# Patient Record
Sex: Male | Born: 1955 | Race: White | Hispanic: No | Marital: Married | Smoking: Never smoker
Health system: Southern US, Community
[De-identification: ages and names within clinical notes are randomized; demographics above are authoritative.]

## PROBLEM LIST (undated history)

## (undated) DIAGNOSIS — C4491 Basal cell carcinoma of skin, unspecified: Secondary | ICD-10-CM

## (undated) DIAGNOSIS — E785 Hyperlipidemia, unspecified: Secondary | ICD-10-CM

## (undated) HISTORY — DX: Hyperlipidemia, unspecified: E78.5

## (undated) HISTORY — PX: MENISCUS REPAIR: SHX5179

## (undated) HISTORY — PX: ROTATOR CUFF REPAIR: SHX139

## (undated) HISTORY — DX: Basal cell carcinoma of skin, unspecified: C44.91

---

## 2002-10-12 ENCOUNTER — Ambulatory Visit (HOSPITAL_COMMUNITY): Admission: RE | Admit: 2002-10-12 | Discharge: 2002-10-12 | Payer: Self-pay | Admitting: *Deleted

## 2016-08-15 ENCOUNTER — Ambulatory Visit: Payer: Self-pay | Admitting: Podiatry

## 2016-08-27 ENCOUNTER — Ambulatory Visit (INDEPENDENT_AMBULATORY_CARE_PROVIDER_SITE_OTHER): Payer: 59 | Admitting: Podiatry

## 2016-08-27 ENCOUNTER — Ambulatory Visit (INDEPENDENT_AMBULATORY_CARE_PROVIDER_SITE_OTHER): Payer: 59

## 2016-08-27 DIAGNOSIS — Q667 Congenital pes cavus, unspecified foot: Secondary | ICD-10-CM

## 2016-08-27 DIAGNOSIS — M779 Enthesopathy, unspecified: Secondary | ICD-10-CM | POA: Diagnosis not present

## 2016-08-27 DIAGNOSIS — M79672 Pain in left foot: Principal | ICD-10-CM

## 2016-08-27 DIAGNOSIS — M79671 Pain in right foot: Secondary | ICD-10-CM

## 2016-08-27 MED ORDER — TRIAMCINOLONE ACETONIDE 10 MG/ML IJ SUSP
10.0000 mg | Freq: Once | INTRAMUSCULAR | Status: AC
  Administered 2016-08-27: 10 mg

## 2016-08-27 NOTE — Progress Notes (Signed)
   Subjective:    Patient ID: Derek Neal, male    DOB: 1956-03-06, 61 y.o.   MRN: [id number]  HPI Pain in the ball of both feet between the 4th and 5th toes     Review of Systems  All other systems reviewed and are negative.      Objective:   Physical Exam        Assessment & Plan:

## 2016-08-29 NOTE — Progress Notes (Signed)
Subjective:     Patient ID: Derek Neal, male   DOB: 02-Dec-1955, 61 y.o.   MRN: [id number]  HPI patient presents with quite a bit of discomfort right over left foot with inflammation around the joint and possibility for some kind of shooting pain that's been present for several months   Review of Systems  All other systems reviewed and are negative.      Objective:   Physical Exam  Constitutional: He is oriented to person, place, and time.  Cardiovascular: Intact distal pulses.   Musculoskeletal: Normal range of motion.  Neurological: He is oriented to person, place, and time.  Skin: Skin is warm.  Nursing note and vitals reviewed.  neurovascular status intact muscle strength adequate range of motion within normal limits with patient found to have inflammatory changes of the third MPJ right over left with fluid buildup around the joint and inability to wear shoe gear comfortably. Patient states that she's been walking differently     Assessment:     Appears to be inflammatory capsulitis third MPJ right with fluid buildup around the joint surface    Plan:     H&P x-rays reviewed and today I did a proximal nerve block aspirated the third MPJ right to get a small amount of clear fluid and injected with quarter cc deck Smith some Kenalog and applied padding to reduce pressure on the joint. Advised on shoe gear modification and reappoint to recheck  X-ray report indicates there is no signs of fracture or other bone pathology or arthritis

## 2016-09-12 ENCOUNTER — Ambulatory Visit (INDEPENDENT_AMBULATORY_CARE_PROVIDER_SITE_OTHER): Payer: 59 | Admitting: Podiatry

## 2016-09-12 DIAGNOSIS — M779 Enthesopathy, unspecified: Secondary | ICD-10-CM | POA: Diagnosis not present

## 2016-09-14 NOTE — Progress Notes (Signed)
Subjective:    Patient ID: Derek Neal, male   DOB: 61 y.o.   MRN: [id number]   HPI patient states she's feeling much better with his right foot with only mild discomfort at this time    ROS      Objective:  Physical Exam Neurovascular status intact with significant reduction of inflammation third MPJ right with fluid buildup reduced    Assessment:    Capsulitis third MPJ right that's improving quite a bit     Plan:    H&P x-rays were reviewed with patient and today advised on continued padding rigid bottom shoes and not going barefoot and will be seen back if symptomatic and will require orthotics

## 2017-03-28 ENCOUNTER — Other Ambulatory Visit: Payer: Self-pay | Admitting: Orthopaedic Surgery

## 2017-03-28 DIAGNOSIS — M25512 Pain in left shoulder: Secondary | ICD-10-CM

## 2017-04-21 ENCOUNTER — Other Ambulatory Visit: Payer: No Typology Code available for payment source

## 2017-04-23 ENCOUNTER — Ambulatory Visit
Admission: RE | Admit: 2017-04-23 | Discharge: 2017-04-23 | Disposition: A | Discharge status: Home / Self Care | Payer: No Typology Code available for payment source | Source: Ambulatory Visit | Attending: Orthopaedic Surgery | Admitting: Orthopaedic Surgery

## 2017-04-23 DIAGNOSIS — M25512 Pain in left shoulder: Secondary | ICD-10-CM

## 2018-05-25 DIAGNOSIS — L812 Freckles: Secondary | ICD-10-CM | POA: Diagnosis not present

## 2018-05-25 DIAGNOSIS — L218 Other seborrheic dermatitis: Secondary | ICD-10-CM | POA: Diagnosis not present

## 2018-05-25 DIAGNOSIS — Z85828 Personal history of other malignant neoplasm of skin: Secondary | ICD-10-CM | POA: Diagnosis not present

## 2018-05-25 DIAGNOSIS — L821 Other seborrheic keratosis: Secondary | ICD-10-CM | POA: Diagnosis not present

## 2018-05-25 DIAGNOSIS — L601 Onycholysis: Secondary | ICD-10-CM | POA: Diagnosis not present

## 2018-05-25 DIAGNOSIS — L570 Actinic keratosis: Secondary | ICD-10-CM | POA: Diagnosis not present

## 2018-06-03 DIAGNOSIS — Z79899 Other long term (current) drug therapy: Secondary | ICD-10-CM | POA: Diagnosis not present

## 2018-06-03 DIAGNOSIS — Z125 Encounter for screening for malignant neoplasm of prostate: Secondary | ICD-10-CM | POA: Diagnosis not present

## 2018-06-03 DIAGNOSIS — R7301 Impaired fasting glucose: Secondary | ICD-10-CM | POA: Diagnosis not present

## 2018-06-03 DIAGNOSIS — E7800 Pure hypercholesterolemia, unspecified: Secondary | ICD-10-CM | POA: Diagnosis not present

## 2018-06-03 DIAGNOSIS — Z0000 Encounter for general adult medical examination without abnormal findings: Secondary | ICD-10-CM | POA: Diagnosis not present

## 2018-06-03 DIAGNOSIS — L409 Psoriasis, unspecified: Secondary | ICD-10-CM | POA: Diagnosis not present

## 2018-06-08 DIAGNOSIS — L409 Psoriasis, unspecified: Secondary | ICD-10-CM | POA: Diagnosis not present

## 2018-06-08 DIAGNOSIS — Z79899 Other long term (current) drug therapy: Secondary | ICD-10-CM | POA: Diagnosis not present

## 2018-06-08 DIAGNOSIS — R7301 Impaired fasting glucose: Secondary | ICD-10-CM | POA: Diagnosis not present

## 2018-06-08 DIAGNOSIS — Z0000 Encounter for general adult medical examination without abnormal findings: Secondary | ICD-10-CM | POA: Diagnosis not present

## 2018-06-08 DIAGNOSIS — E7800 Pure hypercholesterolemia, unspecified: Secondary | ICD-10-CM | POA: Diagnosis not present

## 2018-06-08 DIAGNOSIS — Z125 Encounter for screening for malignant neoplasm of prostate: Secondary | ICD-10-CM | POA: Diagnosis not present

## 2018-06-24 DIAGNOSIS — Z23 Encounter for immunization: Secondary | ICD-10-CM | POA: Diagnosis not present

## 2018-07-03 DIAGNOSIS — L111 Transient acantholytic dermatosis [Grover]: Secondary | ICD-10-CM | POA: Diagnosis not present

## 2018-07-03 DIAGNOSIS — Z85828 Personal history of other malignant neoplasm of skin: Secondary | ICD-10-CM | POA: Diagnosis not present

## 2018-08-21 DIAGNOSIS — M25561 Pain in right knee: Secondary | ICD-10-CM | POA: Diagnosis not present

## 2018-09-09 DIAGNOSIS — M25561 Pain in right knee: Secondary | ICD-10-CM | POA: Diagnosis not present

## 2018-10-14 DIAGNOSIS — M25561 Pain in right knee: Secondary | ICD-10-CM | POA: Diagnosis not present

## 2018-11-02 DIAGNOSIS — M25561 Pain in right knee: Secondary | ICD-10-CM | POA: Diagnosis not present

## 2018-11-16 DIAGNOSIS — M25561 Pain in right knee: Secondary | ICD-10-CM | POA: Diagnosis not present

## 2019-01-10 IMAGING — MR MR SHOULDER*L* W/O CM
4 of 5 series · 31 of 40 positions shown · non-contrast
Comparison: The

CLINICAL DATA: Left shoulder pain for 1 year.

EXAM:
MRI OF THE LEFT SHOULDER WITHOUT CONTRAST
TECHNIQUE: Multiplanar, multisequence MR imaging of the shoulder was performed.
No intravenous contrast was administered.

[Series 3: T2 fat-sat · axial · 4.0mm · 0.25mm/px · z∈[-73,+3]mm · 8 of 20 slices shown (1 of 3)]
[im 1/20]
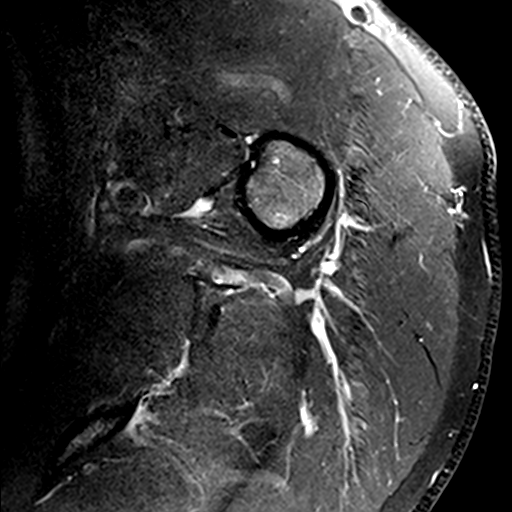
[im 3/20]
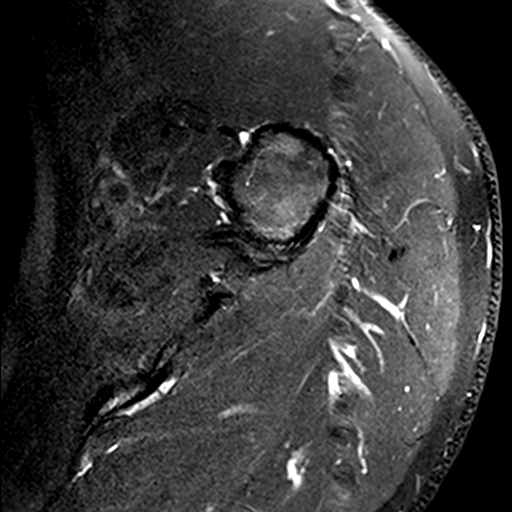
[im 6/20]
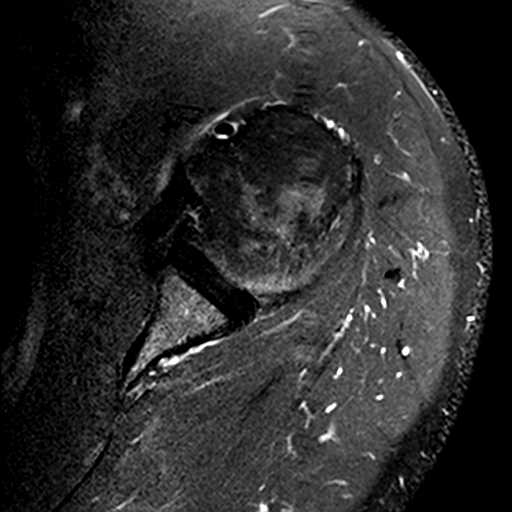
[im 9/20]
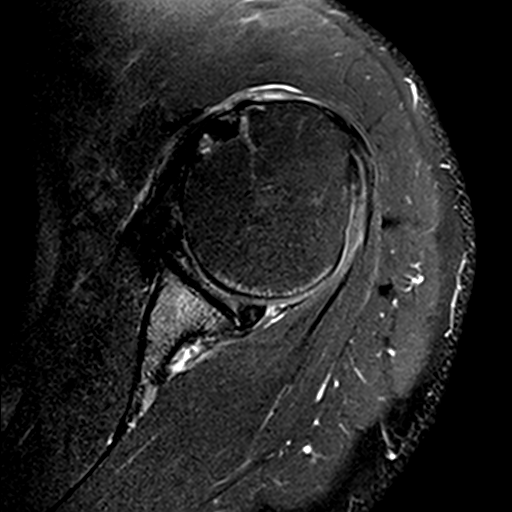
[im 11/20]
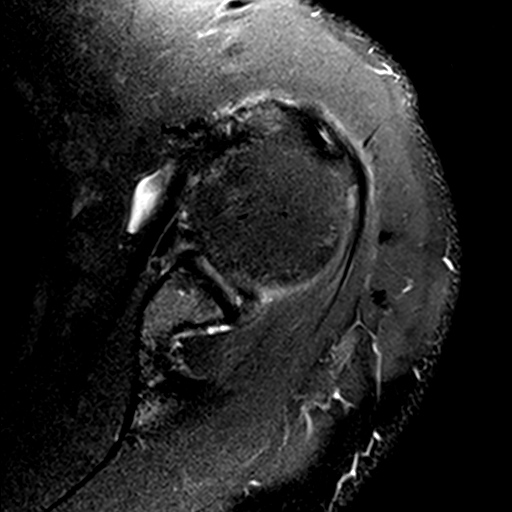
[im 14/20]
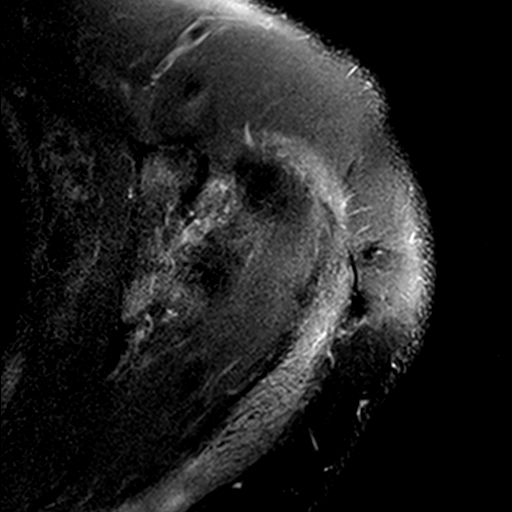
[im 17/20]
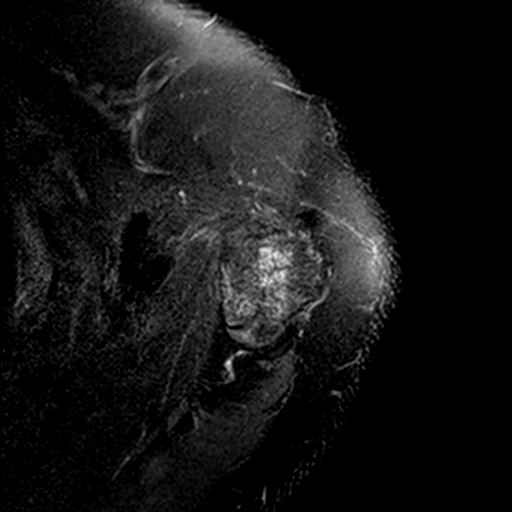
[im 20/20]
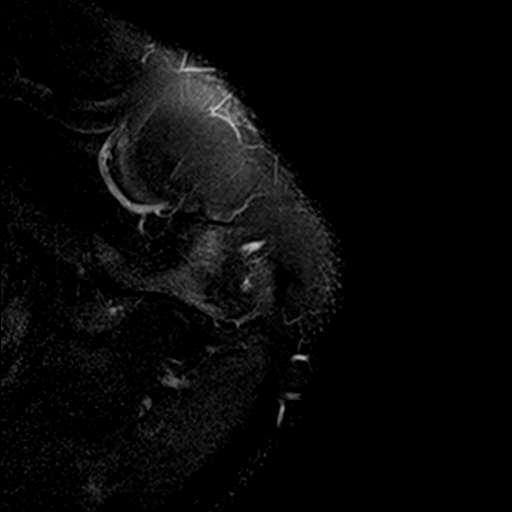

[Series 4: T2 fat-sat · coronal · 4.0mm · 0.73mm/px · 9 of 19 slices shown (2 of 3)]
[im 1/19]
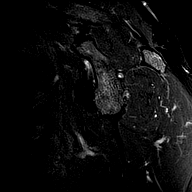
[im 3/19]
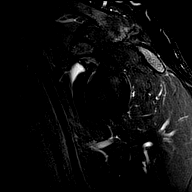
[im 5/19]
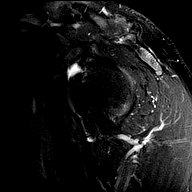
[im 7/19]
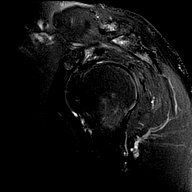
[im 10/19]
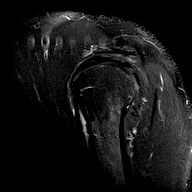
[im 12/19]
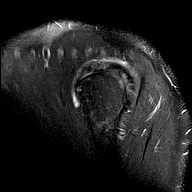
[im 14/19]
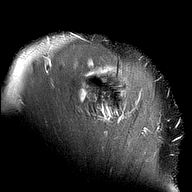
[im 16/19]
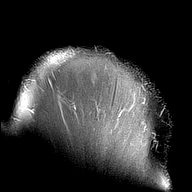
[im 19/19]
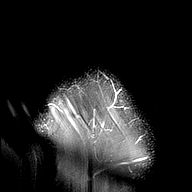

[Series 6: T2 fat-sat · oblique · 4.0mm · 0.55mm/px · 7 of 15 slices shown (3 of 3)]
[im 1/15]
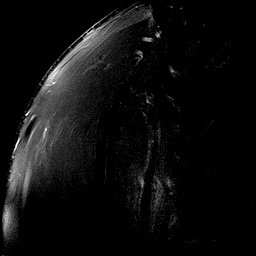
[im 3/15]
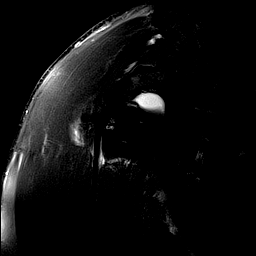
[im 5/15]
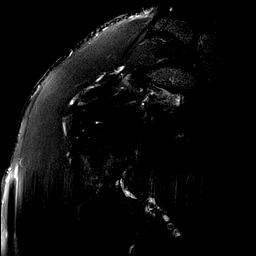
[im 8/15]
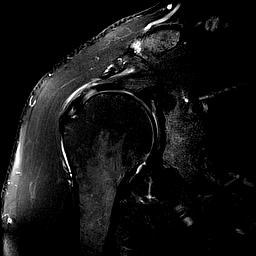
[im 10/15]
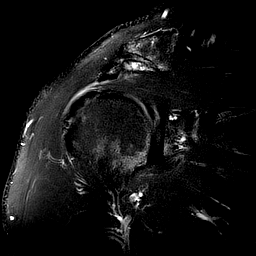
[im 12/15]
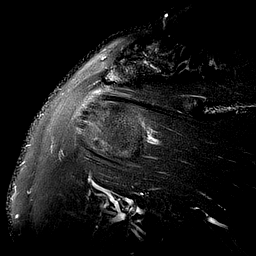
[im 15/15]
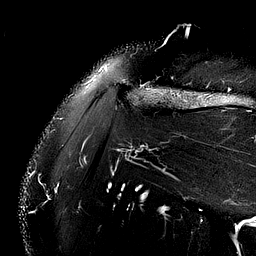

[Series 7: PD · oblique · 4.0mm · 0.27mm/px · 7 of 15 slices shown]
[im 1/15]
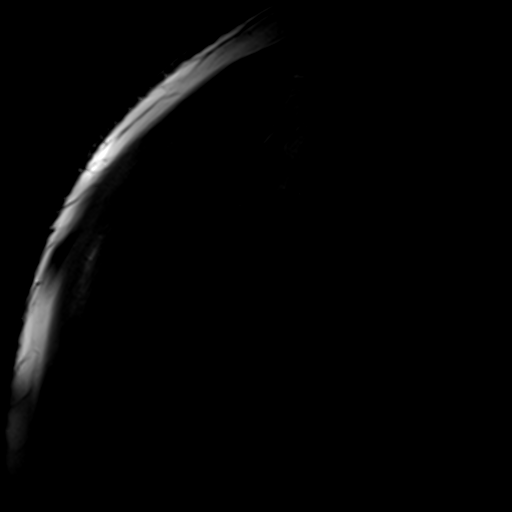
[im 3/15]
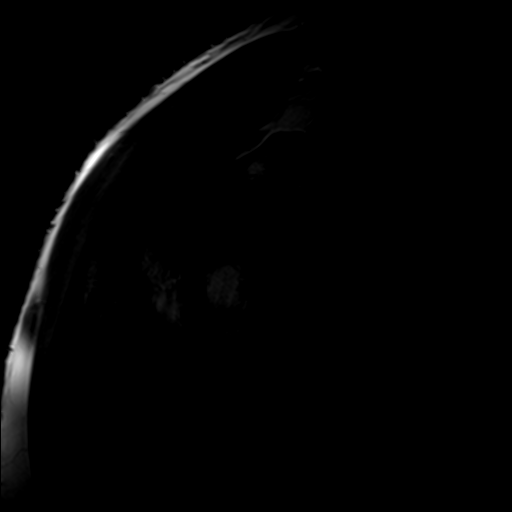
[im 5/15]
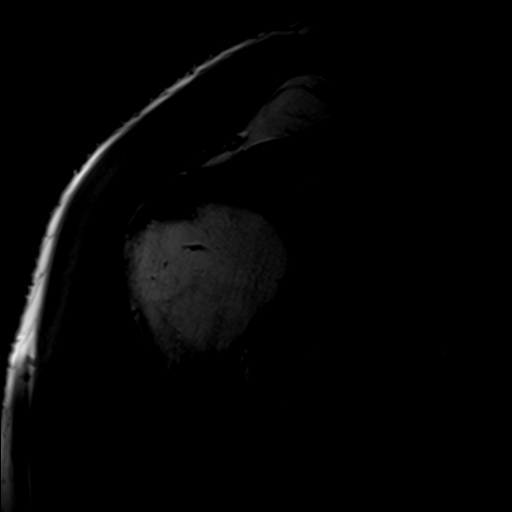
[im 8/15]
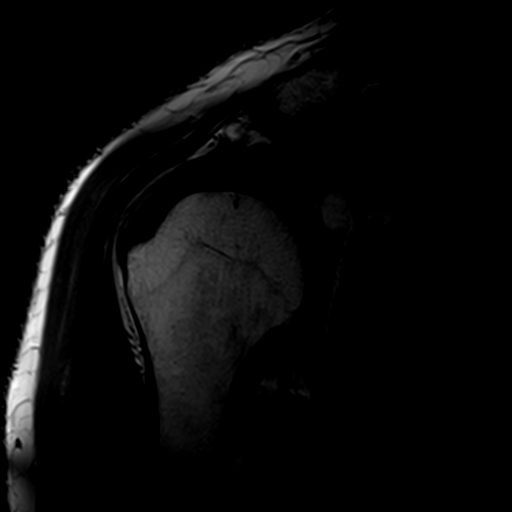
[im 10/15]
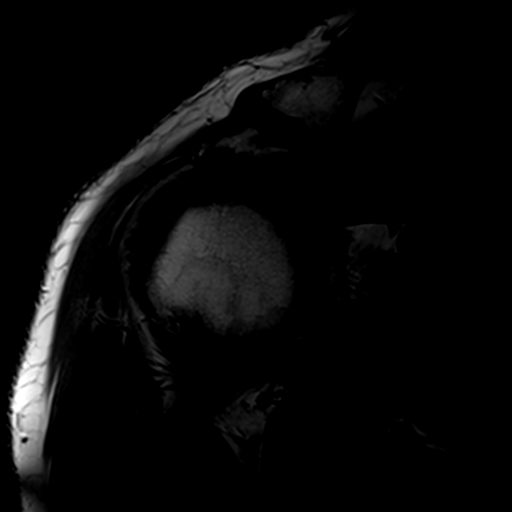
[im 12/15]
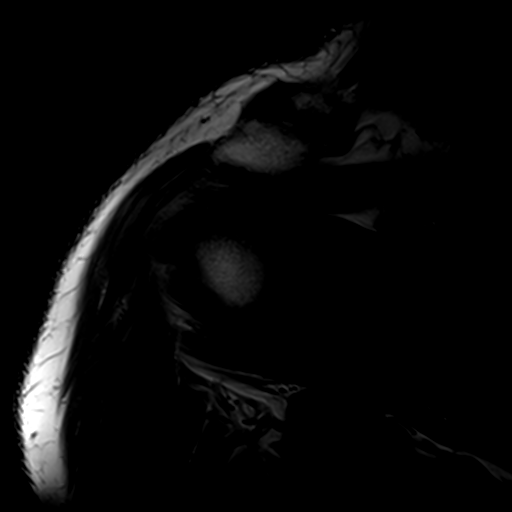
[im 15/15]
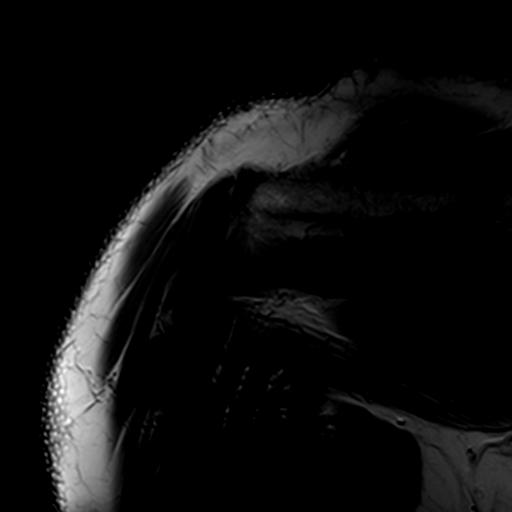

[31 of 40 positions shown; findings below may reference images not displayed]

FINDINGS: Rotator cuff: Moderate supraspinatus and mild infraspinatus and
subscapularis tendinopathy.

Muscles:  Unremarkable

Biceps long head:  Mild tendinopathy of the intra-articular segment.

Acromioclavicular Joint: Prominent spurring and moderate subcortical
marrow edema. Type II acromion. Small but abnormal amount of fluid
in the subacromial subdeltoid bursa.

Glenohumeral Joint: Moderate chondral thinning along portions of the
humeral head. Mild glenohumeral spurring.

Labrum:  Grossly unremarkable

Bones: The coracohumeral distance is only 7 mm, which is low for a
male. This could be supportive evidence for coracohumeral
impingement.

Other: No supplemental non-categorized findings.
IMPRESSION: 1. Subacromial subdeltoid bursitis.
2. Moderate to severe AC joint arthropathy with moderate chondral
thinning in the glenohumeral joint.
3. Moderate supraspinatus and mild infraspinatus, subscapularis, and
biceps tendinopathy without a full-thickness rotator cuff tear.
4. Reduced coracohumeral distance of only 7 mm. Although narrowing
of the space is not considered diagnostic which is a diagnosis
primarily resting on history and physical exam assessment, this is
supportive evidence for the possibility of coracohumeral
impingement.

## 2019-02-02 DIAGNOSIS — Z23 Encounter for immunization: Secondary | ICD-10-CM | POA: Diagnosis not present

## 2019-02-17 DIAGNOSIS — M25561 Pain in right knee: Secondary | ICD-10-CM | POA: Diagnosis not present

## 2019-02-22 DIAGNOSIS — M25512 Pain in left shoulder: Secondary | ICD-10-CM | POA: Diagnosis not present

## 2019-02-22 DIAGNOSIS — M13819 Other specified arthritis, unspecified shoulder: Secondary | ICD-10-CM | POA: Diagnosis not present

## 2019-03-22 DIAGNOSIS — H2513 Age-related nuclear cataract, bilateral: Secondary | ICD-10-CM | POA: Diagnosis not present

## 2019-03-22 DIAGNOSIS — H5203 Hypermetropia, bilateral: Secondary | ICD-10-CM | POA: Diagnosis not present

## 2019-03-22 DIAGNOSIS — H40023 Open angle with borderline findings, high risk, bilateral: Secondary | ICD-10-CM | POA: Diagnosis not present

## 2019-04-08 DIAGNOSIS — M1711 Unilateral primary osteoarthritis, right knee: Secondary | ICD-10-CM | POA: Diagnosis not present

## 2019-04-08 DIAGNOSIS — Y999 Unspecified external cause status: Secondary | ICD-10-CM | POA: Diagnosis not present

## 2019-04-08 DIAGNOSIS — S83231A Complex tear of medial meniscus, current injury, right knee, initial encounter: Secondary | ICD-10-CM | POA: Diagnosis not present

## 2019-04-08 DIAGNOSIS — X58XXXA Exposure to other specified factors, initial encounter: Secondary | ICD-10-CM | POA: Diagnosis not present

## 2019-04-14 DIAGNOSIS — M23306 Other meniscus derangements, unspecified meniscus, right knee: Secondary | ICD-10-CM | POA: Diagnosis not present

## 2019-04-14 DIAGNOSIS — M25561 Pain in right knee: Secondary | ICD-10-CM | POA: Diagnosis not present

## 2019-04-28 DIAGNOSIS — M23306 Other meniscus derangements, unspecified meniscus, right knee: Secondary | ICD-10-CM | POA: Diagnosis not present

## 2019-05-12 DIAGNOSIS — L821 Other seborrheic keratosis: Secondary | ICD-10-CM | POA: Diagnosis not present

## 2019-05-12 DIAGNOSIS — L218 Other seborrheic dermatitis: Secondary | ICD-10-CM | POA: Diagnosis not present

## 2019-05-12 DIAGNOSIS — Z85828 Personal history of other malignant neoplasm of skin: Secondary | ICD-10-CM | POA: Diagnosis not present

## 2019-05-12 DIAGNOSIS — L812 Freckles: Secondary | ICD-10-CM | POA: Diagnosis not present

## 2019-05-12 DIAGNOSIS — L570 Actinic keratosis: Secondary | ICD-10-CM | POA: Diagnosis not present

## 2019-05-12 DIAGNOSIS — L738 Other specified follicular disorders: Secondary | ICD-10-CM | POA: Diagnosis not present

## 2019-06-11 DIAGNOSIS — M659 Synovitis and tenosynovitis, unspecified: Secondary | ICD-10-CM | POA: Diagnosis not present

## 2019-06-11 DIAGNOSIS — M19012 Primary osteoarthritis, left shoulder: Secondary | ICD-10-CM | POA: Diagnosis not present

## 2019-06-11 DIAGNOSIS — M65812 Other synovitis and tenosynovitis, left shoulder: Secondary | ICD-10-CM | POA: Diagnosis not present

## 2019-06-11 DIAGNOSIS — M75112 Incomplete rotator cuff tear or rupture of left shoulder, not specified as traumatic: Secondary | ICD-10-CM | POA: Diagnosis not present

## 2019-06-11 DIAGNOSIS — M75122 Complete rotator cuff tear or rupture of left shoulder, not specified as traumatic: Secondary | ICD-10-CM | POA: Diagnosis not present

## 2019-06-11 DIAGNOSIS — G8918 Other acute postprocedural pain: Secondary | ICD-10-CM | POA: Diagnosis not present

## 2019-06-11 DIAGNOSIS — M7542 Impingement syndrome of left shoulder: Secondary | ICD-10-CM | POA: Diagnosis not present

## 2019-06-11 DIAGNOSIS — S43492A Other sprain of left shoulder joint, initial encounter: Secondary | ICD-10-CM | POA: Diagnosis not present

## 2019-06-21 DIAGNOSIS — M25512 Pain in left shoulder: Secondary | ICD-10-CM | POA: Diagnosis not present

## 2019-06-28 DIAGNOSIS — M25512 Pain in left shoulder: Secondary | ICD-10-CM | POA: Diagnosis not present

## 2019-07-05 DIAGNOSIS — M25512 Pain in left shoulder: Secondary | ICD-10-CM | POA: Diagnosis not present

## 2019-07-12 DIAGNOSIS — M25512 Pain in left shoulder: Secondary | ICD-10-CM | POA: Diagnosis not present

## 2019-07-16 ENCOUNTER — Ambulatory Visit: Payer: No Typology Code available for payment source

## 2019-07-21 DIAGNOSIS — M25512 Pain in left shoulder: Secondary | ICD-10-CM | POA: Diagnosis not present

## 2019-07-26 DIAGNOSIS — M25512 Pain in left shoulder: Secondary | ICD-10-CM | POA: Diagnosis not present

## 2019-08-09 DIAGNOSIS — Z79899 Other long term (current) drug therapy: Secondary | ICD-10-CM | POA: Diagnosis not present

## 2019-08-09 DIAGNOSIS — Z125 Encounter for screening for malignant neoplasm of prostate: Secondary | ICD-10-CM | POA: Diagnosis not present

## 2019-08-09 DIAGNOSIS — Z1159 Encounter for screening for other viral diseases: Secondary | ICD-10-CM | POA: Diagnosis not present

## 2019-08-09 DIAGNOSIS — E7800 Pure hypercholesterolemia, unspecified: Secondary | ICD-10-CM | POA: Diagnosis not present

## 2019-08-11 DIAGNOSIS — M25512 Pain in left shoulder: Secondary | ICD-10-CM | POA: Diagnosis not present

## 2019-08-13 DIAGNOSIS — R7301 Impaired fasting glucose: Secondary | ICD-10-CM | POA: Diagnosis not present

## 2019-08-13 DIAGNOSIS — E7800 Pure hypercholesterolemia, unspecified: Secondary | ICD-10-CM | POA: Diagnosis not present

## 2019-08-13 DIAGNOSIS — Z23 Encounter for immunization: Secondary | ICD-10-CM | POA: Diagnosis not present

## 2019-08-13 DIAGNOSIS — Z1159 Encounter for screening for other viral diseases: Secondary | ICD-10-CM | POA: Diagnosis not present

## 2019-08-13 DIAGNOSIS — Z0000 Encounter for general adult medical examination without abnormal findings: Secondary | ICD-10-CM | POA: Diagnosis not present

## 2019-08-13 DIAGNOSIS — L409 Psoriasis, unspecified: Secondary | ICD-10-CM | POA: Diagnosis not present

## 2019-08-13 DIAGNOSIS — Z79899 Other long term (current) drug therapy: Secondary | ICD-10-CM | POA: Diagnosis not present

## 2019-08-13 DIAGNOSIS — D649 Anemia, unspecified: Secondary | ICD-10-CM | POA: Diagnosis not present

## 2019-08-13 DIAGNOSIS — N400 Enlarged prostate without lower urinary tract symptoms: Secondary | ICD-10-CM | POA: Diagnosis not present

## 2019-08-13 DIAGNOSIS — Z125 Encounter for screening for malignant neoplasm of prostate: Secondary | ICD-10-CM | POA: Diagnosis not present

## 2019-08-13 DIAGNOSIS — L570 Actinic keratosis: Secondary | ICD-10-CM | POA: Diagnosis not present

## 2019-08-16 DIAGNOSIS — M25512 Pain in left shoulder: Secondary | ICD-10-CM | POA: Diagnosis not present

## 2019-08-20 DIAGNOSIS — M25512 Pain in left shoulder: Secondary | ICD-10-CM | POA: Diagnosis not present

## 2019-08-27 DIAGNOSIS — M25512 Pain in left shoulder: Secondary | ICD-10-CM | POA: Diagnosis not present

## 2019-09-08 DIAGNOSIS — M25512 Pain in left shoulder: Secondary | ICD-10-CM | POA: Diagnosis not present

## 2019-09-15 DIAGNOSIS — H8110 Benign paroxysmal vertigo, unspecified ear: Secondary | ICD-10-CM | POA: Diagnosis not present

## 2019-09-15 DIAGNOSIS — M25512 Pain in left shoulder: Secondary | ICD-10-CM | POA: Diagnosis not present

## 2019-09-21 DIAGNOSIS — D649 Anemia, unspecified: Secondary | ICD-10-CM | POA: Diagnosis not present

## 2019-09-21 DIAGNOSIS — E7800 Pure hypercholesterolemia, unspecified: Secondary | ICD-10-CM | POA: Diagnosis not present

## 2019-09-21 DIAGNOSIS — Z79899 Other long term (current) drug therapy: Secondary | ICD-10-CM | POA: Diagnosis not present

## 2019-09-21 DIAGNOSIS — Z0000 Encounter for general adult medical examination without abnormal findings: Secondary | ICD-10-CM | POA: Diagnosis not present

## 2019-09-21 DIAGNOSIS — L409 Psoriasis, unspecified: Secondary | ICD-10-CM | POA: Diagnosis not present

## 2019-09-21 DIAGNOSIS — Z1159 Encounter for screening for other viral diseases: Secondary | ICD-10-CM | POA: Diagnosis not present

## 2019-09-21 DIAGNOSIS — R7301 Impaired fasting glucose: Secondary | ICD-10-CM | POA: Diagnosis not present

## 2019-09-21 DIAGNOSIS — L570 Actinic keratosis: Secondary | ICD-10-CM | POA: Diagnosis not present

## 2019-09-21 DIAGNOSIS — Z125 Encounter for screening for malignant neoplasm of prostate: Secondary | ICD-10-CM | POA: Diagnosis not present

## 2019-10-29 DIAGNOSIS — L089 Local infection of the skin and subcutaneous tissue, unspecified: Secondary | ICD-10-CM | POA: Diagnosis not present

## 2019-10-29 DIAGNOSIS — L02419 Cutaneous abscess of limb, unspecified: Secondary | ICD-10-CM | POA: Diagnosis not present

## 2019-11-15 DIAGNOSIS — Z79899 Other long term (current) drug therapy: Secondary | ICD-10-CM | POA: Diagnosis not present

## 2019-11-15 DIAGNOSIS — Z125 Encounter for screening for malignant neoplasm of prostate: Secondary | ICD-10-CM | POA: Diagnosis not present

## 2019-11-15 DIAGNOSIS — Z1159 Encounter for screening for other viral diseases: Secondary | ICD-10-CM | POA: Diagnosis not present

## 2019-11-15 DIAGNOSIS — L02419 Cutaneous abscess of limb, unspecified: Secondary | ICD-10-CM | POA: Diagnosis not present

## 2019-11-15 DIAGNOSIS — L570 Actinic keratosis: Secondary | ICD-10-CM | POA: Diagnosis not present

## 2019-11-15 DIAGNOSIS — D649 Anemia, unspecified: Secondary | ICD-10-CM | POA: Diagnosis not present

## 2019-11-15 DIAGNOSIS — R7301 Impaired fasting glucose: Secondary | ICD-10-CM | POA: Diagnosis not present

## 2019-11-15 DIAGNOSIS — L409 Psoriasis, unspecified: Secondary | ICD-10-CM | POA: Diagnosis not present

## 2019-11-15 DIAGNOSIS — Z0000 Encounter for general adult medical examination without abnormal findings: Secondary | ICD-10-CM | POA: Diagnosis not present

## 2019-11-15 DIAGNOSIS — E7800 Pure hypercholesterolemia, unspecified: Secondary | ICD-10-CM | POA: Diagnosis not present

## 2019-11-26 DIAGNOSIS — M25531 Pain in right wrist: Secondary | ICD-10-CM | POA: Diagnosis not present

## 2019-11-26 DIAGNOSIS — M545 Low back pain: Secondary | ICD-10-CM | POA: Diagnosis not present

## 2019-12-16 DIAGNOSIS — R7303 Prediabetes: Secondary | ICD-10-CM | POA: Diagnosis not present

## 2019-12-16 DIAGNOSIS — D509 Iron deficiency anemia, unspecified: Secondary | ICD-10-CM | POA: Diagnosis not present

## 2019-12-21 DIAGNOSIS — M5417 Radiculopathy, lumbosacral region: Secondary | ICD-10-CM | POA: Diagnosis not present

## 2019-12-21 DIAGNOSIS — M545 Low back pain: Secondary | ICD-10-CM | POA: Diagnosis not present

## 2020-02-21 DIAGNOSIS — R7303 Prediabetes: Secondary | ICD-10-CM | POA: Diagnosis not present

## 2020-02-21 DIAGNOSIS — D509 Iron deficiency anemia, unspecified: Secondary | ICD-10-CM | POA: Diagnosis not present

## 2020-02-21 DIAGNOSIS — R7309 Other abnormal glucose: Secondary | ICD-10-CM | POA: Diagnosis not present

## 2020-03-27 DIAGNOSIS — H52203 Unspecified astigmatism, bilateral: Secondary | ICD-10-CM | POA: Diagnosis not present

## 2020-03-27 DIAGNOSIS — H40023 Open angle with borderline findings, high risk, bilateral: Secondary | ICD-10-CM | POA: Diagnosis not present

## 2020-03-27 DIAGNOSIS — H5203 Hypermetropia, bilateral: Secondary | ICD-10-CM | POA: Diagnosis not present

## 2020-03-27 DIAGNOSIS — H2513 Age-related nuclear cataract, bilateral: Secondary | ICD-10-CM | POA: Diagnosis not present

## 2020-05-16 DIAGNOSIS — L218 Other seborrheic dermatitis: Secondary | ICD-10-CM | POA: Diagnosis not present

## 2020-05-16 DIAGNOSIS — L821 Other seborrheic keratosis: Secondary | ICD-10-CM | POA: Diagnosis not present

## 2020-05-16 DIAGNOSIS — Z85828 Personal history of other malignant neoplasm of skin: Secondary | ICD-10-CM | POA: Diagnosis not present

## 2020-05-16 DIAGNOSIS — L812 Freckles: Secondary | ICD-10-CM | POA: Diagnosis not present

## 2020-05-16 DIAGNOSIS — L570 Actinic keratosis: Secondary | ICD-10-CM | POA: Diagnosis not present

## 2020-05-22 DIAGNOSIS — M545 Low back pain, unspecified: Secondary | ICD-10-CM | POA: Diagnosis not present

## 2020-06-07 DIAGNOSIS — M545 Low back pain, unspecified: Secondary | ICD-10-CM | POA: Diagnosis not present

## 2020-06-12 DIAGNOSIS — M545 Low back pain, unspecified: Secondary | ICD-10-CM | POA: Diagnosis not present

## 2020-06-14 DIAGNOSIS — M545 Low back pain, unspecified: Secondary | ICD-10-CM | POA: Diagnosis not present

## 2020-06-19 DIAGNOSIS — M545 Low back pain, unspecified: Secondary | ICD-10-CM | POA: Diagnosis not present

## 2020-06-21 DIAGNOSIS — M545 Low back pain, unspecified: Secondary | ICD-10-CM | POA: Diagnosis not present

## 2020-06-26 DIAGNOSIS — M545 Low back pain, unspecified: Secondary | ICD-10-CM | POA: Diagnosis not present

## 2020-06-28 DIAGNOSIS — M545 Low back pain, unspecified: Secondary | ICD-10-CM | POA: Diagnosis not present

## 2020-07-03 DIAGNOSIS — M545 Low back pain, unspecified: Secondary | ICD-10-CM | POA: Diagnosis not present

## 2020-07-12 DIAGNOSIS — M545 Low back pain, unspecified: Secondary | ICD-10-CM | POA: Diagnosis not present

## 2020-07-21 DIAGNOSIS — M545 Low back pain, unspecified: Secondary | ICD-10-CM | POA: Diagnosis not present

## 2020-08-03 DIAGNOSIS — M545 Low back pain, unspecified: Secondary | ICD-10-CM | POA: Diagnosis not present

## 2020-08-08 DIAGNOSIS — M545 Low back pain, unspecified: Secondary | ICD-10-CM | POA: Diagnosis not present

## 2020-08-16 DIAGNOSIS — M545 Low back pain, unspecified: Secondary | ICD-10-CM | POA: Diagnosis not present

## 2020-08-23 DIAGNOSIS — M545 Low back pain, unspecified: Secondary | ICD-10-CM | POA: Diagnosis not present

## 2020-08-28 DIAGNOSIS — M545 Low back pain, unspecified: Secondary | ICD-10-CM | POA: Diagnosis not present

## 2020-09-06 DIAGNOSIS — M545 Low back pain, unspecified: Secondary | ICD-10-CM | POA: Diagnosis not present

## 2020-09-17 DIAGNOSIS — U071 COVID-19: Secondary | ICD-10-CM | POA: Diagnosis not present

## 2020-09-17 DIAGNOSIS — R509 Fever, unspecified: Secondary | ICD-10-CM | POA: Diagnosis not present

## 2020-09-17 DIAGNOSIS — R519 Headache, unspecified: Secondary | ICD-10-CM | POA: Diagnosis not present

## 2020-09-27 DIAGNOSIS — M545 Low back pain, unspecified: Secondary | ICD-10-CM | POA: Diagnosis not present

## 2020-10-04 DIAGNOSIS — M545 Low back pain, unspecified: Secondary | ICD-10-CM | POA: Diagnosis not present

## 2020-10-23 DIAGNOSIS — M545 Low back pain, unspecified: Secondary | ICD-10-CM | POA: Diagnosis not present

## 2020-10-31 DIAGNOSIS — M545 Low back pain, unspecified: Secondary | ICD-10-CM | POA: Diagnosis not present

## 2020-12-25 DIAGNOSIS — L570 Actinic keratosis: Secondary | ICD-10-CM | POA: Diagnosis not present

## 2020-12-25 DIAGNOSIS — Z85828 Personal history of other malignant neoplasm of skin: Secondary | ICD-10-CM | POA: Diagnosis not present

## 2020-12-27 DIAGNOSIS — M545 Low back pain, unspecified: Secondary | ICD-10-CM | POA: Diagnosis not present

## 2021-01-01 DIAGNOSIS — M25551 Pain in right hip: Secondary | ICD-10-CM | POA: Diagnosis not present

## 2021-01-01 DIAGNOSIS — M545 Low back pain, unspecified: Secondary | ICD-10-CM | POA: Diagnosis not present

## 2021-01-18 DIAGNOSIS — L570 Actinic keratosis: Secondary | ICD-10-CM | POA: Diagnosis not present

## 2021-01-18 DIAGNOSIS — E559 Vitamin D deficiency, unspecified: Secondary | ICD-10-CM | POA: Diagnosis not present

## 2021-01-18 DIAGNOSIS — Z0000 Encounter for general adult medical examination without abnormal findings: Secondary | ICD-10-CM | POA: Diagnosis not present

## 2021-01-18 DIAGNOSIS — L409 Psoriasis, unspecified: Secondary | ICD-10-CM | POA: Diagnosis not present

## 2021-01-18 DIAGNOSIS — Z125 Encounter for screening for malignant neoplasm of prostate: Secondary | ICD-10-CM | POA: Diagnosis not present

## 2021-01-18 DIAGNOSIS — E7800 Pure hypercholesterolemia, unspecified: Secondary | ICD-10-CM | POA: Diagnosis not present

## 2021-01-18 DIAGNOSIS — D509 Iron deficiency anemia, unspecified: Secondary | ICD-10-CM | POA: Diagnosis not present

## 2021-01-18 DIAGNOSIS — N400 Enlarged prostate without lower urinary tract symptoms: Secondary | ICD-10-CM | POA: Diagnosis not present

## 2021-01-18 DIAGNOSIS — R7303 Prediabetes: Secondary | ICD-10-CM | POA: Diagnosis not present

## 2021-04-17 DIAGNOSIS — G5621 Lesion of ulnar nerve, right upper limb: Secondary | ICD-10-CM | POA: Diagnosis not present

## 2021-04-17 DIAGNOSIS — R7303 Prediabetes: Secondary | ICD-10-CM | POA: Diagnosis not present

## 2021-04-17 DIAGNOSIS — Z6830 Body mass index (BMI) 30.0-30.9, adult: Secondary | ICD-10-CM | POA: Diagnosis not present

## 2021-04-30 DIAGNOSIS — L570 Actinic keratosis: Secondary | ICD-10-CM | POA: Diagnosis not present

## 2021-04-30 DIAGNOSIS — L812 Freckles: Secondary | ICD-10-CM | POA: Diagnosis not present

## 2021-04-30 DIAGNOSIS — Z85828 Personal history of other malignant neoplasm of skin: Secondary | ICD-10-CM | POA: Diagnosis not present

## 2021-04-30 DIAGNOSIS — D225 Melanocytic nevi of trunk: Secondary | ICD-10-CM | POA: Diagnosis not present

## 2021-04-30 DIAGNOSIS — L821 Other seborrheic keratosis: Secondary | ICD-10-CM | POA: Diagnosis not present

## 2021-05-10 DIAGNOSIS — M545 Low back pain, unspecified: Secondary | ICD-10-CM | POA: Diagnosis not present

## 2021-06-07 DIAGNOSIS — M545 Low back pain, unspecified: Secondary | ICD-10-CM | POA: Diagnosis not present

## 2021-07-12 DIAGNOSIS — M545 Low back pain, unspecified: Secondary | ICD-10-CM | POA: Diagnosis not present

## 2021-07-23 DIAGNOSIS — R051 Acute cough: Secondary | ICD-10-CM | POA: Diagnosis not present

## 2021-07-23 DIAGNOSIS — R5383 Other fatigue: Secondary | ICD-10-CM | POA: Diagnosis not present

## 2021-07-28 DIAGNOSIS — R5383 Other fatigue: Secondary | ICD-10-CM | POA: Diagnosis not present

## 2021-07-28 DIAGNOSIS — R059 Cough, unspecified: Secondary | ICD-10-CM | POA: Diagnosis not present

## 2021-08-08 DIAGNOSIS — M545 Low back pain, unspecified: Secondary | ICD-10-CM | POA: Diagnosis not present

## 2021-08-15 DIAGNOSIS — H40013 Open angle with borderline findings, low risk, bilateral: Secondary | ICD-10-CM | POA: Diagnosis not present

## 2021-08-15 DIAGNOSIS — H5203 Hypermetropia, bilateral: Secondary | ICD-10-CM | POA: Diagnosis not present

## 2021-08-15 DIAGNOSIS — H524 Presbyopia: Secondary | ICD-10-CM | POA: Diagnosis not present

## 2021-08-15 DIAGNOSIS — H2513 Age-related nuclear cataract, bilateral: Secondary | ICD-10-CM | POA: Diagnosis not present

## 2021-08-15 DIAGNOSIS — M545 Low back pain, unspecified: Secondary | ICD-10-CM | POA: Diagnosis not present

## 2021-08-20 DIAGNOSIS — M545 Low back pain, unspecified: Secondary | ICD-10-CM | POA: Diagnosis not present

## 2021-08-29 DIAGNOSIS — M545 Low back pain, unspecified: Secondary | ICD-10-CM | POA: Diagnosis not present

## 2021-09-04 DIAGNOSIS — M542 Cervicalgia: Secondary | ICD-10-CM | POA: Diagnosis not present

## 2021-09-04 DIAGNOSIS — M4692 Unspecified inflammatory spondylopathy, cervical region: Secondary | ICD-10-CM | POA: Diagnosis not present

## 2021-09-04 DIAGNOSIS — M25511 Pain in right shoulder: Secondary | ICD-10-CM | POA: Diagnosis not present

## 2021-09-05 DIAGNOSIS — M545 Low back pain, unspecified: Secondary | ICD-10-CM | POA: Diagnosis not present

## 2021-09-13 DIAGNOSIS — M545 Low back pain, unspecified: Secondary | ICD-10-CM | POA: Diagnosis not present

## 2021-09-14 DIAGNOSIS — M5412 Radiculopathy, cervical region: Secondary | ICD-10-CM | POA: Diagnosis not present

## 2021-09-27 ENCOUNTER — Encounter: Payer: Self-pay | Admitting: Nurse Practitioner

## 2021-09-28 DIAGNOSIS — M5412 Radiculopathy, cervical region: Secondary | ICD-10-CM | POA: Diagnosis not present

## 2021-10-03 DIAGNOSIS — M5412 Radiculopathy, cervical region: Secondary | ICD-10-CM | POA: Diagnosis not present

## 2021-10-16 NOTE — Progress Notes (Addendum)
10/16/2021 NASARIO CZERNIAK [id number] Sep 15, 1955   CHIEF COMPLAINT: Blood in stool   HISTORY OF PRESENT ILLNESS: Barkley Bruns T. Leavell is a 66 year old male with a past medical history of vitamin D deficiency, psoriasis, basal cell carcinoma, remote iron deficiency anemia associated with past blood donations. S/P right knee meniscus surgery 2020. Left rotator cuff surgery.  He presents to evaluation regarding ago, he started passing a small amount of bright, toilet tissue which occurred most daily use for approximately 2 weeks then abated.  He also noted some fecal leakage during this time no associated anorectal pain.  He typically passes a normal once daily but sometimes passes 2-3 BMs daily.  He recently underwent a motorcycle trip for 10 days without rectal discomfort or recurrent rectal bleeding.  He reported undergoing a colonoscopy at the age of 20, 60 and 17 by an [facility].  No history of colon polyps.  He wishes to transition his GI care to Dr. Carlean Purl who he knows from his church.  He remains quite active.  Weight is stable.  No GERD symptoms.  No other complaints at this time.  Social History: He is married.  He has 1 son.  He is retired.  Non-smoker.  He drinks 1 beer every once in a while.  No drug use.  He drinks 1 beer once in a while.   Family History: Maternal grandmother died young from stomach issues, possibly had stomach cancer. No family history of esophageal or colorectal cancer.  No Known Allergies   Outpatient Encounter Medications as of 10/17/2021  Medication Sig   aspirin 81 MG tablet Take 81 mg by mouth daily.   atorvastatin (LIPITOR) 20 MG tablet    No facility-administered encounter medications on file as of 10/17/2021.    REVIEW OF SYSTEMS:  Gen: Denies fever, sweats or chills. No weight loss.  CV: Denies chest pain, palpitations or edema. Resp: Denies cough, shortness of breath of hemoptysis.  GI: See HPI. GU : Denies urinary burning, blood in urine, increased  urinary frequency or incontinence. MS: Denies joint pain, muscles aches or weakness. Derm: Denies rash, itchiness, skin lesions or unhealing ulcers. Psych: Denies depression, anxiety or memory loss. Heme: Denies bruising, easy bleeding. Neuro:  Denies headaches, dizziness or paresthesias. Endo:  Denies any problems with DM, thyroid or adrenal function.  PHYSICAL EXAM: BP 128/80 (BP Location: Left Arm, Patient Position: Sitting, Cuff Size: Normal)   Pulse 68 Comment: irregular  Ht 5' 9.5" (1.765 m) Comment: height measured without shoes  Wt 219 lb 6 oz (99.5 kg)   BMI 31.93 kg/m   General: 66 year old male in no acute distress. Head: Normocephalic and atraumatic. Eyes:  Sclerae non-icteric, conjunctive pink. Ears: Normal auditory acuity. Mouth: Dentition intact. No ulcers or lesions.  Neck: Supple, no lymphadenopathy or thyromegaly.  Lungs: Clear bilaterally to auscultation without wheezes, crackles or rhonchi. Heart: Regular rate and rhythm. No murmur, rub or gallop appreciated.  Abdomen: Soft, nontender, non distended. No masses. No hepatosplenomegaly. Normoactive bowel sounds x 4 quadrants.  Rectal: 2 moderate sized nontender, hypertrophied, edematous, friable posterior sentinel anal tags.  A small amount of brown stool present at the anal opening.  No purulent exudate.  Limited rectal exam secondary to tight anal sphincter.  Estill Bamberg CMA present during exam. Musculoskeletal: Symmetrical with no gross deformities. Skin: Warm and dry. No rash or lesions on visible extremities. Extremities: No edema. Neurological: Alert oriented x 4, no focal deficits.  Psychological:  Alert and cooperative.  Normal mood and affect.  ASSESSMENT AND PLAN:  20) 66 year old male with rectal bleeding.  Exam today showed 2 hypertrophied friable sentinel anal tags. -Diagnostic colonoscopy benefits and risks discussed including risk with sedation, risk of bleeding, perforation and infection  -Refer to  colorectal surgery for further evaluation and to consider excision of the anal sentinel tags -CBC -Apply a small amount of Desitin inside the anal opening and to the external anal area tid as needed for anal or hemorrhoidal irritation/bleeding.  -Further recommendations to be determined after the above evaluation completed -Request past colonoscopy procedure reports from [facility]   ADDENDUM:  Colonoscopy 02/16/2013 by Dr. Michail Sermon: The perianal and digital rectal examinations were normal Internal hemorrhoids were found during retroflexion and were medium sized Anal papillae were hypertrophied Recall colonoscopy 10 years     CC:  Hulan Fess, MD

## 2021-10-17 ENCOUNTER — Other Ambulatory Visit (INDEPENDENT_AMBULATORY_CARE_PROVIDER_SITE_OTHER): Payer: PPO

## 2021-10-17 ENCOUNTER — Encounter: Payer: Self-pay | Admitting: Nurse Practitioner

## 2021-10-17 ENCOUNTER — Ambulatory Visit (INDEPENDENT_AMBULATORY_CARE_PROVIDER_SITE_OTHER): Payer: PPO | Admitting: Nurse Practitioner

## 2021-10-17 VITALS — BP 128/80 | HR 68 | Ht 69.5 in | Wt 219.4 lb

## 2021-10-17 DIAGNOSIS — M545 Low back pain, unspecified: Secondary | ICD-10-CM | POA: Diagnosis not present

## 2021-10-17 DIAGNOSIS — K625 Hemorrhage of anus and rectum: Secondary | ICD-10-CM

## 2021-10-17 DIAGNOSIS — K644 Residual hemorrhoidal skin tags: Secondary | ICD-10-CM

## 2021-10-17 LAB — CBC WITH DIFFERENTIAL/PLATELET
Basophils Absolute: 0 10*3/uL (ref 0.0–0.1)
Basophils Relative: 0.8 % (ref 0.0–3.0)
Eosinophils Absolute: 0.2 10*3/uL (ref 0.0–0.7)
Eosinophils Relative: 2.8 % (ref 0.0–5.0)
HCT: 44.4 % (ref 39.0–52.0)
Hemoglobin: 14.8 g/dL (ref 13.0–17.0)
Lymphocytes Relative: 24.8 % (ref 12.0–46.0)
Lymphs Abs: 1.5 10*3/uL (ref 0.7–4.0)
MCHC: 33.3 g/dL (ref 30.0–36.0)
MCV: 90.3 fl (ref 78.0–100.0)
Monocytes Absolute: 0.6 10*3/uL (ref 0.1–1.0)
Monocytes Relative: 10.8 % (ref 3.0–12.0)
Neutro Abs: 3.7 10*3/uL (ref 1.4–7.7)
Neutrophils Relative %: 60.8 % (ref 43.0–77.0)
Platelets: 173 10*3/uL (ref 150.0–400.0)
RBC: 4.92 Mil/uL (ref 4.22–5.81)
RDW: 13.4 % (ref 11.5–15.5)
WBC: 6 10*3/uL (ref 4.0–10.5)

## 2021-10-17 NOTE — Patient Instructions (Addendum)
Your provider has requested that you go to the basement level for lab work before leaving today. Press "B" on the elevator. The lab is located at the first door on the left as you exit the elevator.  You have been scheduled for an appointment with ___________ at [facility]. Your appointment is on ____________ at ___________. Please arrive at __________ for registration. Make certain to bring a list of current medications, including any over the counter medications or vitamins. Also bring your co-pay if you have one as well as your insurance cards. [facility] is located at [address], [address]. Should you need to reschedule your appointment, please contact them at [phone].  Apply a small amount of Desitin inside the anal opening and to the external anal area three times a day as needed for anal or hemorrhoidal irritation/bleeding.   Benefiber 1 tablespoon once daily as needed to avoid straining   You have been scheduled for a colonoscopy. Please follow written instructions given to you at your visit today.  Please pick up your prep supplies at the pharmacy within the next 1-3 days. If you use inhalers (even only as needed), please bring them with you on the day of your procedure.  The [facility] providers would like to encourage you to use [facility] to communicate with providers for non-urgent requests or questions.  Due to long hold times on the telephone, sending your provider a message by [facility] may be a faster and more efficient way to get a response.  Please allow 48 business hours for a response.  Please remember that this is for non-urgent requests.   Due to recent changes in healthcare laws, you may see the results of your imaging and laboratory studies on MyChart before your provider has had a chance to review them.  We understand that in some cases there may be results that are confusing or concerning to you. Not all laboratory results come back in  the same time frame and the provider may be waiting for multiple results in order to interpret others.  Please give [place] 48 hours in order for your provider to thoroughly review all the results before contacting the office for clarification of your results.

## 2021-10-19 DIAGNOSIS — M5412 Radiculopathy, cervical region: Secondary | ICD-10-CM | POA: Diagnosis not present

## 2021-10-22 DIAGNOSIS — M542 Cervicalgia: Secondary | ICD-10-CM | POA: Diagnosis not present

## 2021-10-22 DIAGNOSIS — M5412 Radiculopathy, cervical region: Secondary | ICD-10-CM | POA: Diagnosis not present

## 2021-10-24 DIAGNOSIS — M545 Low back pain, unspecified: Secondary | ICD-10-CM | POA: Diagnosis not present

## 2021-11-04 DIAGNOSIS — M545 Low back pain, unspecified: Secondary | ICD-10-CM | POA: Diagnosis not present

## 2021-11-12 DIAGNOSIS — K620 Anal polyp: Secondary | ICD-10-CM | POA: Diagnosis not present

## 2021-11-12 DIAGNOSIS — M545 Low back pain, unspecified: Secondary | ICD-10-CM | POA: Diagnosis not present

## 2021-11-12 DIAGNOSIS — K642 Third degree hemorrhoids: Secondary | ICD-10-CM | POA: Diagnosis not present

## 2021-11-13 ENCOUNTER — Encounter: Payer: PPO | Admitting: Internal Medicine

## 2021-12-28 ENCOUNTER — Encounter: Payer: Self-pay | Admitting: Internal Medicine

## 2021-12-29 ENCOUNTER — Encounter: Payer: Self-pay | Admitting: Certified Registered Nurse Anesthetist

## 2022-01-04 ENCOUNTER — Ambulatory Visit (AMBULATORY_SURGERY_CENTER): Payer: PPO | Admitting: Internal Medicine

## 2022-01-04 ENCOUNTER — Encounter: Payer: Self-pay | Admitting: Internal Medicine

## 2022-01-04 VITALS — BP 112/73 | HR 61 | Temp 98.4°F | Resp 15 | Ht 69.0 in | Wt 210.0 lb

## 2022-01-04 DIAGNOSIS — K6289 Other specified diseases of anus and rectum: Secondary | ICD-10-CM

## 2022-01-04 DIAGNOSIS — K625 Hemorrhage of anus and rectum: Secondary | ICD-10-CM

## 2022-01-04 DIAGNOSIS — L309 Dermatitis, unspecified: Secondary | ICD-10-CM | POA: Diagnosis not present

## 2022-01-04 DIAGNOSIS — K648 Other hemorrhoids: Secondary | ICD-10-CM | POA: Diagnosis not present

## 2022-01-04 DIAGNOSIS — E7800 Pure hypercholesterolemia, unspecified: Secondary | ICD-10-CM | POA: Diagnosis not present

## 2022-01-04 MED ORDER — NYSTATIN-TRIAMCINOLONE 100000-0.1 UNIT/GM-% EX OINT: 1.0000 | TOPICAL_OINTMENT | Freq: Two times a day (BID) | CUTANEOUS | 1 refills | Status: AC

## 2022-01-04 MED ORDER — SODIUM CHLORIDE 0.9 % IV SOLN: 500.0000 mL | Freq: Once | INTRAVENOUS | Status: DC

## 2022-01-04 NOTE — Patient Instructions (Addendum)
No polyps or cancer seen.  Hemorrhoids noted and also saw some inflammation in the anal area. I prescribed an ointment for that - use it for 2 weeks straight then as needed.  You can also use a powder called Zeasorb AF - over the counter - would use that chronically.  Don't think you need a repeat colonoscopy on a routine basis - next would be 10 years and at 24 would recommend doing if problems not routinely.  I appreciate the opportunity to care for you. Gatha Mayer, MD, FACG  YOU HAD AN ENDOSCOPIC PROCEDURE TODAY AT [facility]:   Refer to the procedure report that was given to you for any specific questions about what was found during the examination.  If the procedure report does not answer your questions, please call your gastroenterologist to clarify.  If you requested that your care partner not be given the details of your procedure findings, then the procedure report has been included in a sealed envelope for you to review at your convenience later.  YOU SHOULD EXPECT: Some feelings of bloating in the abdomen. Passage of more gas than usual.  Walking can help get rid of the air that was put into your GI tract during the procedure and reduce the bloating. If you had a lower endoscopy (such as a colonoscopy or flexible sigmoidoscopy) you may notice spotting of blood in your stool or on the toilet paper. If you underwent a bowel prep for your procedure, you may not have a normal bowel movement for a few days.  Please Note:  You might notice some irritation and congestion in your nose or some drainage.  This is from the oxygen used during your procedure.  There is no need for concern and it should clear up in a day or so.  SYMPTOMS TO REPORT IMMEDIATELY:  Following lower endoscopy (colonoscopy or flexible sigmoidoscopy):  Excessive amounts of blood in the stool  Significant tenderness or worsening of abdominal pains  Swelling of the abdomen that is new, acute  Fever  of 100F or higher  Following upper endoscopy (EGD)  Vomiting of blood or coffee ground material  New chest pain or pain under the shoulder blades  Painful or persistently difficult swallowing  New shortness of breath  Fever of 100F or higher  Black, tarry-looking stools  For urgent or emergent issues, a gastroenterologist can be reached at any hour by calling [phone]. Do not use MyChart messaging for urgent concerns.    DIET:  We do recommend a small meal at first, but then you may proceed to your regular diet.  Drink plenty of fluids but you should avoid alcoholic beverages for 24 hours.  ACTIVITY:  You should plan to take it easy for the rest of today and you should NOT DRIVE or use heavy machinery until tomorrow (because of the sedation medicines used during the test).    FOLLOW UP: Our staff will call the number listed on your records the next business day following your procedure.  We will call around 7:15- 8:00 am to check on you and address any questions or concerns that you may have regarding the information given to you following your procedure. If we do not reach you, we will leave a message.  If you develop any symptoms (ie: fever, flu-like symptoms, shortness of breath, cough etc.) before then, please call [phone].  If you test positive for Covid 19 in the 2 weeks post procedure, please call and  report this information to [place].    If any biopsies were taken you will be contacted by phone or by letter within the next 1-3 weeks.  Please call us at [phone] if you have not heard about the biopsies in 3 weeks.    SIGNATURES/CONFIDENTIALITY: You and/or your care partner have signed paperwork which will be entered into your electronic medical record.  These signatures attest to the fact that that the information above on your After Visit Summary has been reviewed and is understood.  Full responsibility of the confidentiality of this discharge information lies with  you and/or your care-partner.

## 2022-01-04 NOTE — Progress Notes (Signed)
VS by CW  Pt's states no medical or surgical changes since previsit or office visit.  

## 2022-01-04 NOTE — Progress Notes (Signed)
[facility] History and Physical   Primary Care Physician:  Vernie Shanks, MD (Inactive)   Reason for Procedure:   Rectal bleeding  Plan:    colonoscopy     HPI: Derek Neal is a 66 y.o. male w/hx rectal bleeding   Past Medical History:  Diagnosis Date   Basal cell carcinoma    HLD (hyperlipidemia)     Past Surgical History:  Procedure Laterality Date   MENISCUS REPAIR Right    ROTATOR CUFF REPAIR Left     Prior to Admission medications   Medication Sig Start Date End Date Taking? Authorizing Provider  atorvastatin (LIPITOR) 20 MG tablet  08/22/16  Yes [provider]  Multiple Vitamin (MULTIVITAMIN) tablet Take 1 tablet by mouth daily.   Yes [provider]    Current Outpatient Medications  Medication Sig Dispense Refill   atorvastatin (LIPITOR) 20 MG tablet      Multiple Vitamin (MULTIVITAMIN) tablet Take 1 tablet by mouth daily.     Current Facility-Administered Medications  Medication Dose Route Frequency Provider Last Rate Last Admin   0.9 %  sodium chloride infusion  500 mL Intravenous Once Gatha Mayer, MD        Allergies as of 01/04/2022   (No Known Allergies)    Family History  Problem Relation Age of Onset   Colon cancer Neg Hx    Esophageal cancer Neg Hx    Rectal cancer Neg Hx    Stomach cancer Neg Hx     Social History   Socioeconomic History   Marital status: Married    Spouse name: Not on file   Number of children: 1   Years of education: Not on file   Highest education level: Not on file  Occupational History   Occupation: retired  Tobacco Use   Smoking status: Never   Smokeless tobacco: Never  Vaping Use   Vaping Use: Never used  Substance and Sexual Activity   Alcohol use: Yes    Comment: 1 beer per day   Drug use: Never   Sexual activity: Not on file  Other Topics Concern   Not on file  Social History Narrative   Not on file   Social Determinants of Health   Financial Resource  Strain: Not on file  Food Insecurity: Not on file  Transportation Needs: Not on file  Physical Activity: Not on file  Stress: Not on file  Social Connections: Not on file  Intimate Partner Violence: Not on file    Review of Systems:  All other review of systems negative except as mentioned in the HPI.  Physical Exam: Vital signs BP (!) 146/84   Pulse 83   Temp 98.4 F (36.9 C)   Resp 17   Ht '5\' 9"'$  (1.753 m)   Wt 210 lb (95.3 kg)   SpO2 98%   BMI 31.01 kg/m   General:   Alert,  Well-developed, well-nourished, pleasant and cooperative in NAD Lungs:  Clear throughout to auscultation.   Heart:  Regular rate and rhythm; no murmurs, clicks, rubs,  or gallops. Abdomen:  Soft, nontender and nondistended. Normal bowel sounds.   Neuro/Psych:  Alert and cooperative. Normal mood and affect. A and O x 3   '@Jalaine Riggenbach'$  Simonne Maffucci, MD, [facility] [phone] (pager) 01/04/2022 8:07 AM@

## 2022-01-04 NOTE — Progress Notes (Signed)
Perianal images

## 2022-01-04 NOTE — Op Note (Signed)
[place] Patient Name: Derek Neal Procedure Date: 01/04/2022 8:04 AM MRN: [id number] Endoscopist: Gatha Mayer , MD Age: 66 Referring MD:  Date of Birth: 06/01/1955 Gender: Male Account #: [id number] Procedure:                Colonoscopy Indications:              Rectal bleeding Medicines:                Monitored Anesthesia Care Procedure:                Pre-Anesthesia Assessment:                           - Prior to the procedure, a History and Physical                            was performed, and patient medications and                            allergies were reviewed. The patient's tolerance of                            previous anesthesia was also reviewed. The risks                            and benefits of the procedure and the sedation                            options and risks were discussed with the patient.                            All questions were answered, and informed consent                            was obtained. Prior Anticoagulants: The patient has                            taken no previous anticoagulant or antiplatelet                            agents. ASA Grade Assessment: II - A patient with                            mild systemic disease. After reviewing the risks                            and benefits, the patient was deemed in                            satisfactory condition to undergo the procedure.                           After obtaining informed consent, the colonoscope  was passed under direct vision. Throughout the                            procedure, the patient's blood pressure, pulse, and                            oxygen saturations were monitored continuously. The                            Colonoscope was introduced through the anus and                            advanced to the the cecum, identified by                            appendiceal orifice and ileocecal valve. The                             colonoscopy was performed without difficulty. The                            patient tolerated the procedure well. The quality                            of the bowel preparation was excellent. The                            ileocecal valve, appendiceal orifice, and rectum                            were photographed. The bowel preparation used was                            Miralax via split dose instruction. Scope In: 8:09:57 AM Scope Out: 8:23:39 AM Scope Withdrawal Time: 0 hours 11 minutes 29 seconds  Total Procedure Duration: 0 hours 13 minutes 42 seconds  Findings:                 The perianal exam findings include hypertrophied                            anal papilla(e).                           The digital rectal exam was normal. Pertinent                            negatives include normal prostate (size, shape, and                            consistency).                           The perianal exam findings include a perianal rash.  External and internal hemorrhoids were found.                           Anal papilla(e) were hypertrophied.                           The exam was otherwise without abnormality on                            direct and retroflexion views. Complications:            No immediate complications. Estimated Blood Loss:     Estimated blood loss: none. Impression:               - Hypertrophied anal papilla(e) found on perianal                            exam.                           - Perianal rash found on perianal exam.                           - External and internal hemorrhoids.                           - Anal papilla(e) were hypertrophied.                           - The examination was otherwise normal on direct                            and retroflexion views.                           - No specimens collected. Recommendation:           - Patient has a contact number available for                             emergencies. The signs and symptoms of potential                            delayed complications were discussed with the                            patient. Return to normal activities tomorrow.                            Written discharge instructions were provided to the                            patient.                           - Resume previous diet.                           -  Continue present medications.                           - No repeat colonoscopy due to current age (26                            years or older) and the absence of colonic polyps. Gatha Mayer, MD 01/04/2022 8:38:03 AM This report has been signed electronically.

## 2022-01-04 NOTE — Progress Notes (Signed)
Report given to PACU, vss 

## 2022-01-07 ENCOUNTER — Telehealth: Payer: Self-pay | Admitting: *Deleted

## 2022-01-07 NOTE — Telephone Encounter (Signed)
Attempt for follow up phone call. No answer at number given.  Left message on voicemail.   

## 2022-01-10 DIAGNOSIS — M545 Low back pain, unspecified: Secondary | ICD-10-CM | POA: Diagnosis not present

## 2022-01-24 DIAGNOSIS — M545 Low back pain, unspecified: Secondary | ICD-10-CM | POA: Diagnosis not present

## 2022-02-01 DIAGNOSIS — R351 Nocturia: Secondary | ICD-10-CM | POA: Diagnosis not present

## 2022-02-01 DIAGNOSIS — R0683 Snoring: Secondary | ICD-10-CM | POA: Diagnosis not present

## 2022-02-01 DIAGNOSIS — Z125 Encounter for screening for malignant neoplasm of prostate: Secondary | ICD-10-CM | POA: Diagnosis not present

## 2022-02-01 DIAGNOSIS — E7800 Pure hypercholesterolemia, unspecified: Secondary | ICD-10-CM | POA: Diagnosis not present

## 2022-02-01 DIAGNOSIS — Z6830 Body mass index (BMI) 30.0-30.9, adult: Secondary | ICD-10-CM | POA: Diagnosis not present

## 2022-02-01 DIAGNOSIS — D509 Iron deficiency anemia, unspecified: Secondary | ICD-10-CM | POA: Diagnosis not present

## 2022-02-01 DIAGNOSIS — R7303 Prediabetes: Secondary | ICD-10-CM | POA: Diagnosis not present

## 2022-02-01 DIAGNOSIS — Z0000 Encounter for general adult medical examination without abnormal findings: Secondary | ICD-10-CM | POA: Diagnosis not present

## 2022-04-02 DIAGNOSIS — G4719 Other hypersomnia: Secondary | ICD-10-CM | POA: Diagnosis not present

## 2022-04-02 DIAGNOSIS — Z6830 Body mass index (BMI) 30.0-30.9, adult: Secondary | ICD-10-CM | POA: Diagnosis not present

## 2022-05-08 DIAGNOSIS — D225 Melanocytic nevi of trunk: Secondary | ICD-10-CM | POA: Diagnosis not present

## 2022-05-08 DIAGNOSIS — D0462 Carcinoma in situ of skin of left upper limb, including shoulder: Secondary | ICD-10-CM | POA: Diagnosis not present

## 2022-05-08 DIAGNOSIS — L821 Other seborrheic keratosis: Secondary | ICD-10-CM | POA: Diagnosis not present

## 2022-05-08 DIAGNOSIS — L812 Freckles: Secondary | ICD-10-CM | POA: Diagnosis not present

## 2022-05-08 DIAGNOSIS — Z85828 Personal history of other malignant neoplasm of skin: Secondary | ICD-10-CM | POA: Diagnosis not present

## 2022-05-08 DIAGNOSIS — D485 Neoplasm of uncertain behavior of skin: Secondary | ICD-10-CM | POA: Diagnosis not present

## 2022-05-08 DIAGNOSIS — L218 Other seborrheic dermatitis: Secondary | ICD-10-CM | POA: Diagnosis not present

## 2022-05-08 DIAGNOSIS — D045 Carcinoma in situ of skin of trunk: Secondary | ICD-10-CM | POA: Diagnosis not present

## 2023-02-18 ENCOUNTER — Other Ambulatory Visit (HOSPITAL_BASED_OUTPATIENT_CLINIC_OR_DEPARTMENT_OTHER): Payer: Self-pay | Admitting: Family Medicine

## 2023-02-18 DIAGNOSIS — E7800 Pure hypercholesterolemia, unspecified: Secondary | ICD-10-CM

## 2023-03-20 ENCOUNTER — Ambulatory Visit (HOSPITAL_BASED_OUTPATIENT_CLINIC_OR_DEPARTMENT_OTHER)
Admission: RE | Admit: 2023-03-20 | Discharge: 2023-03-20 | Disposition: A | Discharge status: Home / Self Care | Payer: PPO | Source: Ambulatory Visit | Attending: Family Medicine | Admitting: Family Medicine

## 2023-03-20 DIAGNOSIS — E7800 Pure hypercholesterolemia, unspecified: Secondary | ICD-10-CM

## 2023-07-25 DIAGNOSIS — B349 Viral infection, unspecified: Secondary | ICD-10-CM | POA: Diagnosis not present

## 2023-07-25 DIAGNOSIS — Z03818 Encounter for observation for suspected exposure to other biological agents ruled out: Secondary | ICD-10-CM | POA: Diagnosis not present

## 2023-07-25 DIAGNOSIS — R051 Acute cough: Secondary | ICD-10-CM | POA: Diagnosis not present

## 2023-08-01 DIAGNOSIS — R066 Hiccough: Secondary | ICD-10-CM | POA: Diagnosis not present

## 2023-08-01 DIAGNOSIS — R0981 Nasal congestion: Secondary | ICD-10-CM | POA: Diagnosis not present

## 2023-08-01 DIAGNOSIS — R059 Cough, unspecified: Secondary | ICD-10-CM | POA: Diagnosis not present

## 2023-08-11 DIAGNOSIS — M542 Cervicalgia: Secondary | ICD-10-CM | POA: Diagnosis not present

## 2023-09-10 DIAGNOSIS — R351 Nocturia: Secondary | ICD-10-CM | POA: Diagnosis not present

## 2023-09-10 DIAGNOSIS — N401 Enlarged prostate with lower urinary tract symptoms: Secondary | ICD-10-CM | POA: Diagnosis not present

## 2023-09-10 DIAGNOSIS — R3914 Feeling of incomplete bladder emptying: Secondary | ICD-10-CM | POA: Diagnosis not present

## 2023-09-10 DIAGNOSIS — R3912 Poor urinary stream: Secondary | ICD-10-CM | POA: Diagnosis not present

## 2023-09-15 DIAGNOSIS — M542 Cervicalgia: Secondary | ICD-10-CM | POA: Diagnosis not present

## 2023-10-20 DIAGNOSIS — H5203 Hypermetropia, bilateral: Secondary | ICD-10-CM | POA: Diagnosis not present

## 2023-10-20 DIAGNOSIS — H2513 Age-related nuclear cataract, bilateral: Secondary | ICD-10-CM | POA: Diagnosis not present

## 2023-10-22 DIAGNOSIS — M542 Cervicalgia: Secondary | ICD-10-CM | POA: Diagnosis not present

## 2023-12-09 DIAGNOSIS — N401 Enlarged prostate with lower urinary tract symptoms: Secondary | ICD-10-CM | POA: Diagnosis not present

## 2023-12-09 DIAGNOSIS — R3914 Feeling of incomplete bladder emptying: Secondary | ICD-10-CM | POA: Diagnosis not present

## 2023-12-09 DIAGNOSIS — R351 Nocturia: Secondary | ICD-10-CM | POA: Diagnosis not present

## 2024-01-06 DIAGNOSIS — M542 Cervicalgia: Secondary | ICD-10-CM | POA: Diagnosis not present

## 2024-02-20 DIAGNOSIS — M542 Cervicalgia: Secondary | ICD-10-CM | POA: Diagnosis not present

## 2024-04-02 DIAGNOSIS — M542 Cervicalgia: Secondary | ICD-10-CM | POA: Diagnosis not present

## 2024-04-21 DIAGNOSIS — L812 Freckles: Secondary | ICD-10-CM | POA: Diagnosis not present

## 2024-04-21 DIAGNOSIS — L218 Other seborrheic dermatitis: Secondary | ICD-10-CM | POA: Diagnosis not present

## 2024-04-21 DIAGNOSIS — L821 Other seborrheic keratosis: Secondary | ICD-10-CM | POA: Diagnosis not present

## 2024-04-21 DIAGNOSIS — Z85828 Personal history of other malignant neoplasm of skin: Secondary | ICD-10-CM | POA: Diagnosis not present
